# Patient Record
Sex: Female | Born: 1965 | Race: Black or African American | Hispanic: No | Marital: Single | Smoking: Never smoker
Health system: Southern US, Community
[De-identification: ages and names within clinical notes are randomized; demographics above are authoritative.]

## PROBLEM LIST (undated history)

## (undated) HISTORY — PX: APPENDECTOMY: SHX54

---

## 2020-06-05 ENCOUNTER — Emergency Department (HOSPITAL_COMMUNITY)
Admission: EM | Admit: 2020-06-05 | Discharge: 2020-06-06 | Disposition: A | Discharge status: Home / Self Care | Payer: Managed Care, Other (non HMO) | Attending: Emergency Medicine | Admitting: Emergency Medicine

## 2020-06-05 ENCOUNTER — Emergency Department (HOSPITAL_COMMUNITY): Payer: Managed Care, Other (non HMO)

## 2020-06-05 ENCOUNTER — Encounter (HOSPITAL_COMMUNITY): Payer: Self-pay | Admitting: Emergency Medicine

## 2020-06-05 DIAGNOSIS — S99912A Unspecified injury of left ankle, initial encounter: Secondary | ICD-10-CM | POA: Diagnosis present

## 2020-06-05 DIAGNOSIS — S82852A Displaced trimalleolar fracture of left lower leg, initial encounter for closed fracture: Secondary | ICD-10-CM | POA: Insufficient documentation

## 2020-06-05 DIAGNOSIS — W010XXA Fall on same level from slipping, tripping and stumbling without subsequent striking against object, initial encounter: Secondary | ICD-10-CM | POA: Diagnosis not present

## 2020-06-05 DIAGNOSIS — S82892A Other fracture of left lower leg, initial encounter for closed fracture: Secondary | ICD-10-CM

## 2020-06-05 NOTE — ED Triage Notes (Signed)
Patient [place] with EMS from a park , lost her balance and fell , presents with left ankle swelling/deformity .

## 2020-06-06 ENCOUNTER — Emergency Department (HOSPITAL_COMMUNITY): Payer: Managed Care, Other (non HMO)

## 2020-06-06 MED ORDER — OXYCODONE-ACETAMINOPHEN 5-325 MG PO TABS: 1.0000 | ORAL_TABLET | ORAL | 0 refills | Status: DC | PRN

## 2020-06-06 MED ORDER — OXYCODONE-ACETAMINOPHEN 5-325 MG PO TABS
1.0000 | ORAL_TABLET | Freq: Once | ORAL | Status: AC
  Administered 2020-06-06: 1 via ORAL
  Filled 2020-06-06: qty 1

## 2020-06-06 NOTE — TOC Initial Note (Addendum)
Transition of Care [facility]) - Initial/Assessment Note    Patient Details  Name: Autumn Tapia MRN: [id number] Date of Birth: Sep 16, 1965  [facility] of [facility]) CM/SW Contact:    Lockie Pares, RN Phone Number: 06/06/2020, 11:16 AM  Clinical Narrative:                 Patient came to the ED from a park where they lost their balance and fell resulting in a anklle injury. Could not use crutches safely. Walker ordered from [employer]. They will be brining it to the patient shortly.        Patient Goals and CMS Choice        Expected Discharge Plan and Services                                                Prior Living Arrangements/Services                       Activities of Daily Living      Permission Sought/Granted                  Emotional Assessment              Admission diagnosis:  fall; left ankle deformity  There are no problems to display for this patient.  PCP:  System, Provider Not In Pharmacy:  No Pharmacies Listed    Social Determinants of Health (SDOH) Interventions    Readmission Risk Interventions No flowsheet data found.

## 2020-06-06 NOTE — TOC Initial Note (Deleted)
Transition of Care [facility]) - Initial/Assessment Note    Patient Details  Name: Autumn Tapia MRN: [id number] Date of Birth: 07-31-1966  Transition of Care [facility]) CM/SW Contact:    Lockie Pares, RN Phone Number: 06/06/2020, 12:00 PM  Clinical Narrative:                  Ordered walker for patient through [employer]. They will bring it to patient       Patient Goals and CMS Choice        Expected Discharge Plan and Services                           DME Arranged: Dan Humphreys DME Agency: [employer] Date DME Agency Contacted: 06/06/20 Time DME Agency Contacted: [phone] Representative spoke with at DME Agency: Lucretia            Prior Living Arrangements/Services                       Activities of Daily Living      Permission Sought/Granted                  Emotional Assessment              Admission diagnosis:  fall; left ankle deformity  There are no problems to display for this patient.  PCP:  System, Provider Not In Pharmacy:   [id number] - [place], [place] - [address] AT [facility] [address]  [place] [address] Phone: [phone] Fax: [phone]     Social Determinants of Health (SDOH) Interventions    Readmission Risk Interventions No flowsheet data found.

## 2020-06-06 NOTE — Discharge Instructions (Signed)
Your x-rays today confirmed a trimalleolar fracture as we discussed.  The orthopedics team wants to see you this week for further management.  Please keep it elevated " toes above your nose" as we discussed.  You may use the pain medicine help with your symptoms.  Please rest and stay hydrated.  If any symptoms change or worsen, please return to the nearest emergency department.

## 2020-06-06 NOTE — Progress Notes (Signed)
Orthopedic Tech Progress Note Patient Details:  Autumn Tapia 1966/02/28 [id number] Patient unable to use crutches. RN ordered walker. Ortho Devices Type of Ortho Device: Stirrup splint, Post (short leg) splint Ortho Device/Splint Location: LLE Ortho Device/Splint Interventions: Application, Ordered   Post Interventions Patient Tolerated: Well Instructions Provided: Care of device   Alcee Sipos A Dekker Verga 06/06/2020, 9:54 AM

## 2020-06-06 NOTE — ED Notes (Signed)
Reviewed discharge instructions with patient. Follow-up care and medications reviewed. Patient verbalized understanding. Patient A&Ox4, VSS upon discharge. 

## 2020-06-06 NOTE — ED Provider Notes (Signed)
[facility] EMERGENCY DEPARTMENT Provider Note   CSN: [id number] Arrival date & time: 06/05/20  1934     History No chief complaint on file.   Autumn Tapia is a 54 y.o. female.  The history is provided by the patient and medical records. No language interpreter was used.  Ankle Pain Location:  Ankle Time since incident:  14 hours Injury: yes   Mechanism of injury: fall   Fall:    Fall occurred:  Standing   Impact surface:  Grass   Entrapped after fall: no   Ankle location:  L ankle Pain details:    Quality:  Aching and sharp   Radiates to:  Does not radiate   Severity:  Severe   Onset quality:  Sudden   Timing:  Constant   Progression:  Unchanged Chronicity:  New Prior injury to area:  No Relieved by:  Immobilization Worsened by:  Bearing weight Ineffective treatments:  None tried Associated symptoms: no back pain, no fatigue, no fever, no itching, no muscle weakness, no neck pain, no numbness and no stiffness        History reviewed. No pertinent past medical history.  There are no problems to display for this patient.   History reviewed. No pertinent surgical history.   OB History   No obstetric history on file.     No family history on file.  Social History   Tobacco Use  . Smoking status: Never Smoker  . Smokeless tobacco: Never Used  Substance Use Topics  . Alcohol use: Never  . Drug use: Never    Home Medications Prior to Admission medications   Not on File    Allergies    Patient has no known allergies.  Review of Systems   Review of Systems  Constitutional: Negative for chills, diaphoresis, fatigue and fever.  HENT: Negative for congestion.   Respiratory: Negative for cough, chest tightness and shortness of breath.   Cardiovascular: Negative for chest pain.  Gastrointestinal: Negative for abdominal pain, constipation, diarrhea and nausea.  Genitourinary: Negative for flank pain.  Musculoskeletal: Negative for  back pain, neck pain, neck stiffness and stiffness.  Skin: Negative for itching.  Neurological: Negative for syncope, weakness, light-headedness and numbness.  Psychiatric/Behavioral: Negative for agitation and confusion.  All other systems reviewed and are negative.   Physical Exam Updated Vital Signs BP (!) 118/105   Pulse 88   Temp 99.2 F (37.3 C) (Oral)   Resp 16   Ht 5\' 9"  (1.753 m)   Wt 122 kg   SpO2 100%   BMI 39.72 kg/m   Physical Exam Vitals and nursing note reviewed.  Constitutional:      General: She is not in acute distress.    Appearance: She is well-developed. She is not ill-appearing, toxic-appearing or diaphoretic.  HENT:     Head: Normocephalic and atraumatic.     Right Ear: External ear normal.     Left Ear: External ear normal.     Nose: Nose normal.  Eyes:     Conjunctiva/sclera: Conjunctivae normal.     Pupils: Pupils are equal, round, and reactive to light.  Cardiovascular:     Rate and Rhythm: Normal rate.     Pulses: Normal pulses.     Heart sounds: No murmur heard.   Pulmonary:     Effort: Pulmonary effort is normal. No respiratory distress.     Breath sounds: No stridor. No wheezing or rales.  Chest:     Chest  wall: No tenderness.  Abdominal:     General: There is no distension.     Tenderness: There is no abdominal tenderness. There is no rebound.  Musculoskeletal:        General: Swelling, tenderness and signs of injury present.     Cervical back: Normal range of motion and neck supple.     Left ankle: Swelling present. Tenderness present. Normal pulse.       Legs:     Comments: Tenderness and swelling in the left ankle.  No lacerations or open injury seen.  Good pulses, strength and sensation.  No other tenderness present.  Skin:    General: Skin is warm.     Coloration: Skin is not pale.     Findings: No erythema or rash.  Neurological:     General: No focal deficit present.     Mental Status: She is alert and oriented to  person, place, and time.     Sensory: No sensory deficit.     Motor: No weakness or abnormal muscle tone.     Deep Tendon Reflexes: Reflexes are normal and symmetric.  Psychiatric:        Mood and Affect: Mood normal.     ED Results / Procedures / Treatments   Labs (all labs ordered are listed, but only abnormal results are displayed) Labs Reviewed - No data to display  EKG None  Radiology DG Ankle Complete Left  Result Date: 06/05/2020 CLINICAL DATA:  54 year old female with left ankle trauma and pain. EXAM: LEFT ANKLE COMPLETE - 3+ VIEW COMPARISON:  None. FINDINGS: There is a mildly displaced oblique fracture of the distal fibula with approximately 5 mm lateral displacement of the distal fracture fragment. Mildly displaced fracture of the medial malleolus and nondisplaced fracture of the posterior malleolus. No dislocation. There is soft tissue swelling of the ankle. No radiopaque foreign object or soft tissue gas. IMPRESSION: Trimalleolar fracture. No dislocation. Electronically Signed   By: Elgie Collard M.D.   On: 06/05/2020 22:21   CT Ankle Left Wo Contrast  Result Date: 06/06/2020 CLINICAL DATA:  Left ankle fracture, operative planning. EXAM: CT OF THE LEFT ANKLE WITHOUT CONTRAST TECHNIQUE: Multidetector CT imaging of the left ankle was performed according to the standard protocol. Multiplanar CT image reconstructions were also generated. COMPARISON:  Radiographs dated 06/05/2020 FINDINGS: Bones/Joint/Cartilage Stage IV Weber B fracture of the ankle with mildly comminuted oblique fibular component, minimally comminuted transverse medial malleolar component, and mildly comminuted oblique posterior malleolar component otherwise with the typical configuration. 3-4 mm of medial shift of the long axis of the tibial shaft with respect to the talus. This fracture is unstable. No other fractures are identified. Plantar calcaneal spur. Ligaments Suboptimally assessed by CT. Muscles and  Tendons No flexor tendon entrapment. Minimal distal Achilles tendinopathy is suspected. Thickening of the medial band of the plantar fascia, plantar fasciitis not excluded. Soft tissues Expected subcutaneous edema along the ankle circumferentially. IMPRESSION: 1. Stage IV Weber B fracture of the ankle with mildly comminuted oblique fibular component, minimally comminuted transverse medial malleolar component, and mildly comminuted oblique posterior malleolar component. 3-4 mm of medial shift of the long axis of the tibial shaft with respect to the talus. This fracture is unstable. 2. Minimal distal Achilles tendinopathy is suspected. 3. Thickening of the medial band of the plantar fascia, plantar fasciitis not excluded. 4. Plantar calcaneal spur. Electronically Signed   By: Gaylyn Rong M.D.   On: 06/06/2020 09:07    Procedures Procedures (  including critical care time)  Medications Ordered in ED Medications  oxyCODONE-acetaminophen (PERCOCET/ROXICET) 5-325 MG per tablet 1 tablet (1 tablet Oral Given 06/06/20 [id number])    ED Course  I have reviewed the triage vital signs and the nursing notes.  Pertinent labs & imaging results that were available during my care of the patient were reviewed by me and considered in my medical decision making (see chart for details).    MDM Rules/Calculators/A&P                          Autumn AlbinoSean Tapia is a 54 y.o. female with no significant past medical history who presents for left ankle injury.  Patient reports that around 6 PM yesterday, she slipped on some acorns down embankment and twisted her left ankle.  She had immediate onset of severe pain.  She has been unable to bear weight on it.  She denies any history of orthopedic injuries in the past or previous fractures.  He presented the emergency department has been waiting for around 12 hours in the waiting room prior to coming to the exam room.  She denies any other injuries or pains.  She specifically denies any  neck pain, chest pain, back pain, abdominal pain, hip pain, knee pains.  She denies any numbness, tingling, or weakness.  She does report her ankle is now swollen compared to what it was before but no lacerations or open injuries.  On exam, patient has tenderness in the left ankle.  There is palpable DP and PT pulse.  She can wiggle her toes and move her ankle with pain.  She has normal sensation in the toes.  No significant tenderness in the knee or upper leg.  No hip tenderness.  Lungs clear chest nontender.  Abdomen nontender.  Patient resting comfortably.  Patient had x-rays of the left ankle performed in triage showing a trimalleolar fracture with very mild displacement but no dislocation.  We will touch base with orthopedics to discuss timeline for follow-up but I do anticipate patient needing splinting and discharged with crutches to follow-up with orthopedics to discuss likely surgery.  Patient is agreeable to this plan.  Anticipate splint placement and discharge.  Orthopedics called back and recommends a posterior short leg splint with a U stirrup and significant elevation at home.  They also recommended getting a CT scan for operative planning.  This was ordered.  They recommended following with Dr. Victorino DikeHewitt with orthopedic surgery this week for operative management.  After splint is placed, patient will be given crutches and discharged home with written for pain medication and instructions for early follow-up.  Patient will follow up with orthopedics for further management.  Patient agrees with plan of care and was discharged after splint placement  Final Clinical Impression(s) / ED Diagnoses Final diagnoses:  Closed fracture of left ankle, initial encounter    Rx / DC Orders ED Discharge Orders         Ordered    oxyCODONE-acetaminophen (PERCOCET/ROXICET) 5-325 MG tablet  Every 4 hours PRN        06/06/20 1158         Clinical Impression: 1. Closed fracture of left ankle,  initial encounter     Disposition: Discharge  Condition: Good  I have discussed the results, Dx and Tx plan with the pt(& family if present). He/she/they expressed understanding and agree(s) with the plan. Discharge instructions discussed at great length. Strict return precautions discussed and pt &/  or family have verbalized understanding of the instructions. No further questions at time of discharge.    Discharge Medication List as of 06/06/2020 12:00 PM    START taking these medications   Details  oxyCODONE-acetaminophen (PERCOCET/ROXICET) 5-325 MG tablet Take 1 tablet by mouth every 4 (four) hours as needed., Starting Sat 06/06/2020, Normal        Follow Up: Toni Arthurs, MD [address] [place] [address] [place] [address] [phone]     [facility] EMERGENCY DEPARTMENT [address] [id number] [address] [place] [address] [phone]    [facility] [address] [place] [address] [phone] Schedule an appointment as soon as possible for a visit       Adalida Garver, Canary Brim, MD 06/06/20 1546

## 2020-06-06 NOTE — ED Notes (Signed)
Pt unable to use crutches safely.

## 2020-06-23 ENCOUNTER — Ambulatory Visit (HOSPITAL_COMMUNITY): Payer: Managed Care, Other (non HMO) | Admitting: Anesthesiology

## 2020-06-23 ENCOUNTER — Other Ambulatory Visit: Payer: Self-pay

## 2020-06-23 ENCOUNTER — Ambulatory Visit (HOSPITAL_COMMUNITY): Payer: Managed Care, Other (non HMO)

## 2020-06-23 ENCOUNTER — Encounter (HOSPITAL_COMMUNITY): Payer: Self-pay | Admitting: Orthopedic Surgery

## 2020-06-23 ENCOUNTER — Encounter (HOSPITAL_COMMUNITY)
Admission: RE | Disposition: A | Discharge status: Home / Self Care | Payer: Self-pay | Source: Home / Self Care | Attending: Orthopedic Surgery

## 2020-06-23 ENCOUNTER — Ambulatory Visit (HOSPITAL_COMMUNITY)
Admission: RE | Admit: 2020-06-23 | Discharge: 2020-06-23 | Disposition: A | Discharge status: Home / Self Care | Payer: Managed Care, Other (non HMO) | Attending: Orthopedic Surgery | Admitting: Orthopedic Surgery

## 2020-06-23 DIAGNOSIS — Z20822 Contact with and (suspected) exposure to covid-19: Secondary | ICD-10-CM | POA: Diagnosis not present

## 2020-06-23 DIAGNOSIS — Z09 Encounter for follow-up examination after completed treatment for conditions other than malignant neoplasm: Secondary | ICD-10-CM

## 2020-06-23 DIAGNOSIS — W19XXXA Unspecified fall, initial encounter: Secondary | ICD-10-CM | POA: Insufficient documentation

## 2020-06-23 DIAGNOSIS — S82852A Displaced trimalleolar fracture of left lower leg, initial encounter for closed fracture: Secondary | ICD-10-CM | POA: Diagnosis not present

## 2020-06-23 HISTORY — PX: ORIF ANKLE FRACTURE: SHX5408

## 2020-06-23 LAB — POCT PREGNANCY, URINE: Preg Test, Ur: NEGATIVE

## 2020-06-23 LAB — SARS CORONAVIRUS 2 BY RT PCR (HOSPITAL ORDER, PERFORMED IN ~~LOC~~ HOSPITAL LAB): SARS Coronavirus 2: NEGATIVE

## 2020-06-23 SURGERY — OPEN REDUCTION INTERNAL FIXATION (ORIF) ANKLE FRACTURE
Anesthesia: Regional | Site: Ankle | Laterality: Left

## 2020-06-23 MED ORDER — FENTANYL CITRATE (PF) 100 MCG/2ML IJ SOLN
INTRAMUSCULAR | Status: DC | PRN
Start: 2020-06-23 — End: 2020-06-23
  Administered 2020-06-23: 50 ug via INTRAVENOUS
  Administered 2020-06-23 (×2): 100 ug via INTRAVENOUS

## 2020-06-23 MED ORDER — AMISULPRIDE (ANTIEMETIC) 5 MG/2ML IV SOLN: 10.0000 mg | Freq: Once | INTRAVENOUS | Status: DC | PRN

## 2020-06-23 MED ORDER — PHENYLEPHRINE 40 MCG/ML (10ML) SYRINGE FOR IV PUSH (FOR BLOOD PRESSURE SUPPORT)
PREFILLED_SYRINGE | INTRAVENOUS | Status: DC | PRN
  Administered 2020-06-23: 80 ug via INTRAVENOUS

## 2020-06-23 MED ORDER — PROMETHAZINE HCL 25 MG/ML IJ SOLN: 6.2500 mg | INTRAMUSCULAR | Status: DC | PRN

## 2020-06-23 MED ORDER — CEFAZOLIN SODIUM-DEXTROSE 2-4 GM/100ML-% IV SOLN
2.0000 g | INTRAVENOUS | Status: AC
  Administered 2020-06-23: 2 g via INTRAVENOUS

## 2020-06-23 MED ORDER — MIDAZOLAM HCL 2 MG/2ML IJ SOLN: 1.0000 mg | Freq: Once | INTRAMUSCULAR | Status: DC

## 2020-06-23 MED ORDER — DEXMEDETOMIDINE (PRECEDEX) IN NS 20 MCG/5ML (4 MCG/ML) IV SYRINGE
PREFILLED_SYRINGE | INTRAVENOUS | Status: DC | PRN
  Administered 2020-06-23 (×3): 4 ug via INTRAVENOUS
  Administered 2020-06-23: 8 ug via INTRAVENOUS

## 2020-06-23 MED ORDER — CEFAZOLIN SODIUM-DEXTROSE 2-4 GM/100ML-% IV SOLN
INTRAVENOUS | Status: AC
  Filled 2020-06-23: qty 100

## 2020-06-23 MED ORDER — PROPOFOL 10 MG/ML IV BOLUS
INTRAVENOUS | Status: DC | PRN
  Administered 2020-06-23: 200 mg via INTRAVENOUS

## 2020-06-23 MED ORDER — FENTANYL CITRATE (PF) 250 MCG/5ML IJ SOLN
INTRAMUSCULAR | Status: AC
  Filled 2020-06-23: qty 5

## 2020-06-23 MED ORDER — CHLORHEXIDINE GLUCONATE 0.12 % MT SOLN: 15.0000 mL | Freq: Once | OROMUCOSAL | Status: AC

## 2020-06-23 MED ORDER — LACTATED RINGERS IV SOLN: INTRAVENOUS | Status: DC

## 2020-06-23 MED ORDER — DEXAMETHASONE SODIUM PHOSPHATE 10 MG/ML IJ SOLN
INTRAMUSCULAR | Status: AC
  Filled 2020-06-23: qty 1

## 2020-06-23 MED ORDER — MIDAZOLAM HCL 2 MG/2ML IJ SOLN
INTRAMUSCULAR | Status: AC
  Administered 2020-06-23: 2 mg via INTRAVENOUS
  Filled 2020-06-23: qty 2

## 2020-06-23 MED ORDER — MIDAZOLAM HCL 2 MG/2ML IJ SOLN: 2.0000 mg | Freq: Once | INTRAMUSCULAR | Status: AC

## 2020-06-23 MED ORDER — OXYCODONE HCL 5 MG PO TABS: 5.0000 mg | ORAL_TABLET | ORAL | 0 refills | Status: AC | PRN

## 2020-06-23 MED ORDER — ROPIVACAINE HCL 5 MG/ML IJ SOLN
INTRAMUSCULAR | Status: DC | PRN
  Administered 2020-06-23: 50 mL via EPIDURAL

## 2020-06-23 MED ORDER — DEXAMETHASONE SODIUM PHOSPHATE 10 MG/ML IJ SOLN
INTRAMUSCULAR | Status: DC | PRN
  Administered 2020-06-23: 8 mg via INTRAVENOUS

## 2020-06-23 MED ORDER — LORAZEPAM 2 MG/ML IJ SOLN
2.0000 mg | Freq: Once | INTRAMUSCULAR | Status: AC | PRN
  Administered 2020-06-23: 2 mg via INTRAVENOUS

## 2020-06-23 MED ORDER — BUPIVACAINE HCL (PF) 0.5 % IJ SOLN
INTRAMUSCULAR | Status: AC
  Filled 2020-06-23: qty 30

## 2020-06-23 MED ORDER — LORAZEPAM 2 MG/ML IJ SOLN
INTRAMUSCULAR | Status: AC
  Filled 2020-06-23: qty 1

## 2020-06-23 MED ORDER — OXYCODONE HCL 5 MG/5ML PO SOLN: 5.0000 mg | Freq: Once | ORAL | Status: DC | PRN

## 2020-06-23 MED ORDER — FENTANYL CITRATE (PF) 100 MCG/2ML IJ SOLN: 50.0000 ug | Freq: Once | INTRAMUSCULAR | Status: AC

## 2020-06-23 MED ORDER — OXYCODONE HCL 5 MG PO TABS: 5.0000 mg | ORAL_TABLET | Freq: Once | ORAL | Status: DC | PRN

## 2020-06-23 MED ORDER — CHLORHEXIDINE GLUCONATE 0.12 % MT SOLN
OROMUCOSAL | Status: AC
  Administered 2020-06-23: 15 mL via OROMUCOSAL
  Filled 2020-06-23: qty 15

## 2020-06-23 MED ORDER — ONDANSETRON 4 MG PO TBDP: 4.0000 mg | ORAL_TABLET | Freq: Three times a day (TID) | ORAL | 0 refills | Status: AC | PRN

## 2020-06-23 MED ORDER — FENTANYL CITRATE (PF) 100 MCG/2ML IJ SOLN
INTRAMUSCULAR | Status: AC
  Administered 2020-06-23: 50 ug via INTRAVENOUS
  Filled 2020-06-23: qty 2

## 2020-06-23 MED ORDER — ONDANSETRON HCL 4 MG/2ML IJ SOLN
INTRAMUSCULAR | Status: DC | PRN
  Administered 2020-06-23: 4 mg via INTRAVENOUS

## 2020-06-23 MED ORDER — FENTANYL CITRATE (PF) 100 MCG/2ML IJ SOLN
INTRAMUSCULAR | Status: AC
  Filled 2020-06-23: qty 2

## 2020-06-23 MED ORDER — PROPOFOL 10 MG/ML IV BOLUS
INTRAVENOUS | Status: AC
  Filled 2020-06-23: qty 20

## 2020-06-23 MED ORDER — ORAL CARE MOUTH RINSE: 15.0000 mL | Freq: Once | OROMUCOSAL | Status: AC

## 2020-06-23 MED ORDER — FENTANYL CITRATE (PF) 100 MCG/2ML IJ SOLN
25.0000 ug | INTRAMUSCULAR | Status: DC | PRN
  Administered 2020-06-23 (×2): 50 ug via INTRAVENOUS

## 2020-06-23 MED ORDER — 0.9 % SODIUM CHLORIDE (POUR BTL) OPTIME
TOPICAL | Status: DC | PRN
  Administered 2020-06-23: 1000 mL

## 2020-06-23 MED ORDER — LIDOCAINE 2% (20 MG/ML) 5 ML SYRINGE
INTRAMUSCULAR | Status: DC | PRN
  Administered 2020-06-23: 80 mg via INTRAVENOUS

## 2020-06-23 SURGICAL SUPPLY — 67 items
ALCOHOL 70% 16 OZ (MISCELLANEOUS) ×3 IMPLANT
BANDAGE ESMARK 6X9 LF (GAUZE/BANDAGES/DRESSINGS) ×1 IMPLANT
BIT DRILL 2 CANN GRADUATED (BIT) ×3 IMPLANT
BIT DRILL 2.5 CANN LNG (BIT) ×3 IMPLANT
BIT DRILL 3.5 CANN STRL (BIT) ×3 IMPLANT
BNDG COHESIVE 4X5 TAN STRL (GAUZE/BANDAGES/DRESSINGS) ×3 IMPLANT
BNDG ELASTIC 4X5.8 VLCR STR LF (GAUZE/BANDAGES/DRESSINGS) ×3 IMPLANT
BNDG ELASTIC 6X5.8 VLCR STR LF (GAUZE/BANDAGES/DRESSINGS) ×6 IMPLANT
BNDG ESMARK 6X9 LF (GAUZE/BANDAGES/DRESSINGS) ×3
CANISTER SUCT 3000ML PPV (MISCELLANEOUS) ×3 IMPLANT
COVER SURGICAL LIGHT HANDLE (MISCELLANEOUS) ×3 IMPLANT
COVER WAND RF STERILE (DRAPES) IMPLANT
CUFF TOURN SGL QUICK 34 (TOURNIQUET CUFF)
CUFF TRNQT CYL 34X4.125X (TOURNIQUET CUFF) IMPLANT
DRAPE C-ARM 42X72 X-RAY (DRAPES) ×3 IMPLANT
DRAPE C-ARMOR (DRAPES) ×3 IMPLANT
DRAPE U-SHAPE 47X51 STRL (DRAPES) ×3 IMPLANT
DRSG ADAPTIC 3X8 NADH LF (GAUZE/BANDAGES/DRESSINGS) ×3 IMPLANT
DRSG PAD ABDOMINAL 8X10 ST (GAUZE/BANDAGES/DRESSINGS) ×6 IMPLANT
DURAPREP 26ML APPLICATOR (WOUND CARE) ×3 IMPLANT
ELECT REM PT RETURN 9FT ADLT (ELECTROSURGICAL) ×3
ELECTRODE REM PT RTRN 9FT ADLT (ELECTROSURGICAL) ×1 IMPLANT
GAUZE SPONGE 4X4 12PLY STRL (GAUZE/BANDAGES/DRESSINGS) ×6 IMPLANT
GLOVE BIO SURGEON STRL SZ7.5 (GLOVE) ×6 IMPLANT
GLOVE BIOGEL PI IND STRL 8 (GLOVE) ×2 IMPLANT
GLOVE BIOGEL PI INDICATOR 8 (GLOVE) ×4
GOWN STRL REUS W/ TWL LRG LVL3 (GOWN DISPOSABLE) ×2 IMPLANT
GOWN STRL REUS W/ TWL XL LVL3 (GOWN DISPOSABLE) ×2 IMPLANT
GOWN STRL REUS W/TWL LRG LVL3 (GOWN DISPOSABLE) ×4
GOWN STRL REUS W/TWL XL LVL3 (GOWN DISPOSABLE) ×4
GUIDEWIRE 1.35MM (WIRE) ×6 IMPLANT
KIT BASIN OR (CUSTOM PROCEDURE TRAY) ×3 IMPLANT
KIT TURNOVER KIT B (KITS) ×3 IMPLANT
NEEDLE HYPO 25GX1X1/2 BEV (NEEDLE) IMPLANT
NS IRRIG 1000ML POUR BTL (IV SOLUTION) ×3 IMPLANT
PACK ORTHO EXTREMITY (CUSTOM PROCEDURE TRAY) ×3 IMPLANT
PAD ARMBOARD 7.5X6 YLW CONV (MISCELLANEOUS) ×6 IMPLANT
PAD CAST 4YDX4 CTTN HI CHSV (CAST SUPPLIES) ×2 IMPLANT
PADDING CAST COTTON 4X4 STRL (CAST SUPPLIES) ×4
PADDING CAST COTTON 6X4 STRL (CAST SUPPLIES) ×3 IMPLANT
PADDING CAST SYNTHETIC 4 (CAST SUPPLIES) ×2
PADDING CAST SYNTHETIC 4X4 STR (CAST SUPPLIES) ×1 IMPLANT
PLATE LOCK DIST FIB 5H TTNIUM (Plate) ×3 IMPLANT
SCREW CANN ST QFIX 4X32 (Screw) ×6 IMPLANT
SCREW CORT FT 3.5X22 (Screw) ×3 IMPLANT
SCREW CORT FT 3.5X28 (Screw) ×3 IMPLANT
SCREW LOCK 18X3XVALOPRFL (Screw) ×2 IMPLANT
SCREW LOCKING 3.0X18 (Screw) ×4 IMPLANT
SCREW LOCKING VARIABLE 3.0X16 (Screw) ×6 IMPLANT
SCREW LP TI 3.5X14MM (Screw) ×9 IMPLANT
SCREW QCKFIX CANN 4.0X40MM (Screw) ×3 IMPLANT
SPLINT FIBERGLASS 4X30 (CAST SUPPLIES) ×6 IMPLANT
SPONGE LAP 18X18 RF (DISPOSABLE) IMPLANT
SUCTION FRAZIER HANDLE 10FR (MISCELLANEOUS) ×2
SUCTION TUBE FRAZIER 10FR DISP (MISCELLANEOUS) ×1 IMPLANT
SUT ETHILON 3 0 PS 1 (SUTURE) ×9 IMPLANT
SUT SILK 2 0 (SUTURE) ×2
SUT SILK 2-0 18XBRD TIE 12 (SUTURE) ×1 IMPLANT
SUT VIC AB 0 CT1 27 (SUTURE)
SUT VIC AB 0 CT1 27XBRD ANBCTR (SUTURE) IMPLANT
SUT VIC AB 2-0 CT1 27 (SUTURE) ×4
SUT VIC AB 2-0 CT1 TAPERPNT 27 (SUTURE) ×2 IMPLANT
SYR CONTROL 10ML LL (SYRINGE) IMPLANT
TOWEL GREEN STERILE (TOWEL DISPOSABLE) ×6 IMPLANT
TUBE CONNECTING 12'X1/4 (SUCTIONS) ×1
TUBE CONNECTING 12X1/4 (SUCTIONS) ×2 IMPLANT
YANKAUER SUCT BULB TIP NO VENT (SUCTIONS) ×3 IMPLANT

## 2020-06-23 NOTE — Brief Op Note (Signed)
06/23/2020  3:02 PM  PATIENT:  Autumn Tapia  54 y.o. female  PRE-OPERATIVE DIAGNOSIS:  Left ankle bimalleolar fracture  POST-OPERATIVE DIAGNOSIS:  Same  PROCEDURE:  OPEN REDUCTION INTERNAL FIXATION (ORIF) BIMALLEOLAR ANKLE FRACTURE  SURGEON:  Yolonda Kida, MD   ASSISTANT: Dion Saucier, PA-C  ANESTHESIA:   General  COMPLICATIONS: None

## 2020-06-23 NOTE — Anesthesia Procedure Notes (Signed)
Procedure Name: LMA Insertion Date/Time: 06/23/2020 1:13 PM Performed by: Jodell Cipro, CRNA Pre-anesthesia Checklist: Patient identified, Emergency Drugs available, Suction available and Patient being monitored Patient Re-evaluated:Patient Re-evaluated prior to induction Oxygen Delivery Method: Circle System Utilized Preoxygenation: Pre-oxygenation with 100% oxygen Induction Type: IV induction Ventilation: Mask ventilation without difficulty LMA: LMA inserted LMA Size: 4.0 Number of attempts: 1 Placement Confirmation: positive ETCO2 Tube secured with: Tape Dental Injury: Teeth and Oropharynx as per pre-operative assessment

## 2020-06-23 NOTE — Anesthesia Preprocedure Evaluation (Addendum)
Anesthesia Evaluation  Patient identified by MRN, date of birth, ID band Patient awake    Reviewed: Allergy & Precautions, NPO status , Patient's Chart, lab work & pertinent test results  Airway Mallampati: II  TM Distance: >3 FB Neck ROM: Full    Dental no notable dental hx.    Pulmonary neg pulmonary ROS,    Pulmonary exam normal breath sounds clear to auscultation       Cardiovascular Exercise Tolerance: Good negative cardio ROS Normal cardiovascular exam Rhythm:Regular Rate:Normal     Neuro/Psych negative neurological ROS     GI/Hepatic negative GI ROS, Neg liver ROS,   Endo/Other  Morbid obesity  Renal/GU negative Renal ROS     Musculoskeletal negative musculoskeletal ROS (+)   Abdominal (+) + obese,   Peds  Hematology negative hematology ROS (+)   Anesthesia Other Findings Ankle fracture  Reproductive/Obstetrics negative OB ROS                            Anesthesia Physical Anesthesia Plan  ASA: II  Anesthesia Plan: General and Regional   Post-op Pain Management: GA combined w/ Regional for post-op pain   Induction:   PONV Risk Score and Plan: 3 and Midazolam, Dexamethasone and Ondansetron  Airway Management Planned: Oral ETT  Additional Equipment:   Intra-op Plan:   Post-operative Plan: Extubation in OR  Informed Consent: I have reviewed the patients History and Physical, chart, labs and discussed the procedure including the risks, benefits and alternatives for the proposed anesthesia with the patient or authorized representative who has indicated his/her understanding and acceptance.       Plan Discussed with: CRNA and Anesthesiologist  Anesthesia Plan Comments:        Anesthesia Quick Evaluation

## 2020-06-23 NOTE — Transfer of Care (Signed)
Immediate Anesthesia Transfer of Care Note  Patient: Autumn Tapia  Procedure(s) Performed: OPEN REDUCTION INTERNAL FIXATION (ORIF) BIMALLEOLAR ANKLE FRACTURE (Left Ankle)  Patient Location: PACU  Anesthesia Type:GA combined with regional for post-op pain  Level of Consciousness: awake, alert , oriented and patient cooperative  Airway & Oxygen Therapy: Patient Spontanous Breathing  Post-op Assessment: Report given to RN, Post -op Vital signs reviewed and stable and Patient moving all extremities X 4  Post vital signs: Reviewed and stable  Last Vitals:  Vitals Value Taken Time  BP 146/119 06/23/20 1505  Temp    Pulse 106 06/23/20 1506  Resp 31 06/23/20 1506  SpO2 97 % 06/23/20 1506  Vitals shown include unvalidated device data.  Last Pain:  Vitals:   06/23/20 1255  TempSrc:   PainSc: 0-No pain      Patients Stated Pain Goal: 3 (06/23/20 1255)  Complications: No complications documented.

## 2020-06-23 NOTE — Op Note (Signed)
Date of Surgery: 06/23/2020  INDICATIONS: Ms. Steveson is a 54 y.o.-year-old female who sustained a left ankle fracture; she was indicated for open reduction and internal fixation due to the displaced nature of the articular fracture and came to the operating room today for this procedure. The patient did consent to the procedure after discussion of the risks and benefits.  PREOPERATIVE DIAGNOSIS: left closed trimalleolar ankle fracture  POSTOPERATIVE DIAGNOSIS: Same.  PROCEDURE: Open treatment of left ankle fracture with internal fixation.  Trimalleolar w/o fixation of posterior malleolus CPT 27822.   SURGEON: Gevork Ayyad P. Aundria Rud, M.D.  ASSIST: Dion Saucier, PA-C  Attestation:  PA Sharon Seller was present for the entire procedure.  He was used for positioning the patient and reduction of fracture and instrumentation of the hardware.  He was used for closure and application of short leg splint..  ANESTHESIA:  general, adductor cannal regional  TOURNIQUET TIME: 300 mmHg for 80 minutes  IV FLUIDS AND URINE: See anesthesia.  ESTIMATED BLOOD LOSS: 20 mL.  IMPLANTS: Arthrex distal fibular plate with distal locking screws and proximal cortical nonlocking Medially to 4.0 mm cannulated screws  COMPLICATIONS: None.  DESCRIPTION OF PROCEDURE: The patient was brought to the operating room and placed supine on the operating table.  The patient had been signed prior to the procedure and this was documented. The patient had the anesthesia placed by the anesthesiologist.  A nonsterile tourniquet was placed on the upper thigh.  The prep verification and incision time-outs were performed to confirm that this was the correct patient, site, side and location. The patient had an SCD on the opposite lower extremity. The patient did receive antibiotics prior to the incision and was re-dosed during the procedure as needed at indicated intervals.  The patient had the lower extremity prepped and draped in the standard  surgical fashion.  The extremity was exsanguinated using an esmarch bandage and the tourniquet was inflated to 300 mm Hg.   Incision was made over the distal fibula and the fracture was exposed and reduced anatomically with a clamp. A lag screw was placed. I then applied a distal fibular locking plate and secured it proximally with non-locking screws. Distally secured with locking screws.  Bone quality was poor. I used c-arm to confirm satisfactory reduction and fixation.   I then turned my attention to the medial malleolus. Incision was made over the medial malleolus and the fracture exposed and held provisionally with a clamp. 2 guidepins were placed for the 4.0 mm cannulated screws and then confirmation of reduction was made with fluoroscopy. I then placed 2  78mm screws which had satisfactory fixation.   The syndesmosis was stressed using live fluoroscopy and found to be stable.   The wounds were irrigated, and closed with vicryl with routine closure for the skin. The wounds were not injected with local anesthetic. Sterile gauze was applied followed by a posterior splint with a stirrup. She was awakened and returned to the PACU in stable and satisfactory condition. There were no complications.    POSTOPERATIVE PLAN: Ms. Cowman will remain nonweightbearing on this leg for approximately 6 weeks; Ms. Fosse will return for suture removal in 2 weeks.  He will be immobilized in a short leg splint and then transitioned to a CAM walker at his first follow up appointment.  Ms. Kley will receive DVT prophylaxis based on other medications, activity level, and risk ratio of bleeding to thrombosis.  She will use 81 mg asa x 6 weeks.  Barbara Cower  Johnnette Litter, MD [facility] [place] [phone] 3:03 PM

## 2020-06-23 NOTE — Anesthesia Procedure Notes (Signed)
Anesthesia Regional Block: Popliteal block   Pre-Anesthetic Checklist: ,, timeout performed, Correct Patient, Correct Site, Correct Laterality, Correct Procedure, Correct Position, site marked, Risks and benefits discussed,  Surgical consent,  Pre-op evaluation,  At surgeon's request and post-op pain management  Laterality: Left  Prep: chloraprep       Needles:  Injection technique: Single-shot  Needle Type: Echogenic Stimulator Needle     Needle Length: 10cm      Additional Needles:   Procedures:,,,, ultrasound used (permanent image in chart),,,,  Narrative:  Start time: 06/23/2020 12:40 PM End time: 06/23/2020 12:50 PM Injection made incrementally with aspirations every 5 mL.  Performed by: Personally  Anesthesiologist: Mellody Dance, MD  Additional Notes: A functioning IV was confirmed and monitors were applied.  Sterile prep and drape, hand hygiene and sterile gloves were used.  Negative aspiration and test dose prior to incremental administration of local anesthetic. The patient tolerated the procedure well.Ultrasound  guidance: relevant anatomy identified, needle position confirmed, local anesthetic spread visualized around nerve(s), vascular puncture avoided.  Image printed for medical record.

## 2020-06-23 NOTE — Discharge Instructions (Signed)
-   maintain left leg in splint at all times and No weight bearing to left leg.  - elevate the left leg as able around the clock with 'toes above nose.'  - for mild to moderate pain use tylenol and ibuprofen around the clock every 6 hours respectively.   For breakthrough pain use oxycodone as necessary  - keep your splint dry   - for prevention of blood clots take an 81 mg asa once daily for 6 weeks.  - return to see Dr. Aundria Rud in 2 weeks.

## 2020-06-23 NOTE — H&P (Signed)
ORTHOPAEDIC H and P  REQUESTING PHYSICIAN: Yolonda Kida, MD  PCP:  System, Provider Not In  Chief Complaint: Left ankle fracture  HPI: Autumn Tapia is a 54 y.o. female who complains of left ankle pain following a fall on 06/05/2020.  Here today for ORIF.  No new complaints.  Ready for surgery.  History reviewed. No pertinent past medical history. Past Surgical History:  Procedure Laterality Date  . APPENDECTOMY     Social History   Socioeconomic History  . Marital status: Single    Spouse name: Not on file  . Number of children: Not on file  . Years of education: Not on file  . Highest education level: Not on file  Occupational History  . Not on file  Tobacco Use  . Smoking status: Never Smoker  . Smokeless tobacco: Never Used  Vaping Use  . Vaping Use: Never used  Substance and Sexual Activity  . Alcohol use: Never  . Drug use: Never  . Sexual activity: Not Currently  Other Topics Concern  . Not on file  Social History Narrative  . Not on file   Social Determinants of Health   Financial Resource Strain:   . Difficulty of Paying Living Expenses: Not on file  Food Insecurity:   . Worried About Programme researcher, broadcasting/film/video in the Last Year: Not on file  . Ran Out of Food in the Last Year: Not on file  Transportation Needs:   . Lack of Transportation (Medical): Not on file  . Lack of Transportation (Non-Medical): Not on file  Physical Activity:   . Days of Exercise per Week: Not on file  . Minutes of Exercise per Session: Not on file  Stress:   . Feeling of Stress : Not on file  Social Connections:   . Frequency of Communication with Friends and Family: Not on file  . Frequency of Social Gatherings with Friends and Family: Not on file  . Attends Religious Services: Not on file  . Active Member of Clubs or Organizations: Not on file  . Attends Banker Meetings: Not on file  . Marital Status: Not on file   History reviewed. No pertinent family  history. No Known Allergies Prior to Admission medications   Medication Sig Start Date End Date Taking? Authorizing Provider  oxyCODONE-acetaminophen (PERCOCET/ROXICET) 5-325 MG tablet Take 1 tablet by mouth every 4 (four) hours as needed. Patient not taking: Reported on 06/16/2020 06/06/20   Tegeler, Canary Brim, MD   No results found.  Positive ROS: All other systems have been reviewed and were otherwise negative with the exception of those mentioned in the HPI and as above.  Physical Exam: General: Alert, no acute distress Cardiovascular: No pedal edema Respiratory: No cyanosis, no use of accessory musculature GI: No organomegaly, abdomen is soft and non-tender Skin: No lesions in the area of chief complaint Neurologic: Sensation intact distally Psychiatric: Patient is competent for consent with normal mood and affect Lymphatic: No axillary or cervical lymphadenopathy  MUSCULOSKELETAL:  LLE-  Splint in place.  Distally NVI  Assessment: Left ankle closed trimalleolar ankle fracture  Plan: - The risks, benefits, and alternatives were discussed with the patient. There are risks associated with the surgery including, but not limited to, problems with anesthesia (death), infection, differences in leg length/angulation/rotation, fracture of bones, loosening or failure of implants, malunion, nonunion, hematoma (blood accumulation) which may require surgical drainage, blood clots, pulmonary embolism, nerve injury (foot drop), and blood vessel injury. The  patient understands these risks and elects to proceed.  - plan for tentative dc home post op    Yolonda Kida, MD Cell [phone]    06/23/2020 12:43 PM

## 2020-06-23 NOTE — Anesthesia Procedure Notes (Signed)
Anesthesia Regional Block: Adductor canal block   Pre-Anesthetic Checklist: ,, timeout performed, Correct Patient, Correct Site, Correct Laterality, Correct Procedure, Correct Position, site marked, Risks and benefits discussed,  Surgical consent,  Pre-op evaluation,  At surgeon's request and post-op pain management  Laterality: Left  Prep: chloraprep       Needles:  Injection technique: Single-shot  Needle Type: Echogenic Stimulator Needle     Needle Length: 10cm      Additional Needles:   Procedures:,,,, ultrasound used (permanent image in chart),,,,  Narrative:  Start time: 06/23/2020 12:40 PM End time: 06/23/2020 12:50 PM Injection made incrementally with aspirations every 5 mL.  Performed by: Personally  Anesthesiologist: Mellody Dance, MD  Additional Notes: A functioning IV was confirmed and monitors were applied.  Sterile prep and drape, hand hygiene and sterile gloves were used.  Negative aspiration and test dose prior to incremental administration of local anesthetic. The patient tolerated the procedure well.Ultrasound  guidance: relevant anatomy identified, needle position confirmed, local anesthetic spread visualized around nerve(s), vascular puncture avoided.  Image printed for medical record.

## 2020-06-24 NOTE — Anesthesia Postprocedure Evaluation (Signed)
Anesthesia Post Note  Patient: Autumn Tapia  Procedure(s) Performed: OPEN REDUCTION INTERNAL FIXATION (ORIF) BIMALLEOLAR ANKLE FRACTURE (Left Ankle)     Patient location during evaluation: PACU Anesthesia Type: Regional and General Level of consciousness: sedated Pain management: pain level controlled Vital Signs Assessment: post-procedure vital signs reviewed and stable Respiratory status: spontaneous breathing and respiratory function stable Cardiovascular status: stable Postop Assessment: no apparent nausea or vomiting Anesthetic complications: no   No complications documented.  Last Vitals:  Vitals:   06/23/20 1615 06/23/20 1631  BP: (!) 141/82 133/81  Pulse: 87 82  Resp: 19 (!) 24  Temp:    SpO2: 98% 100%    Last Pain:  Vitals:   06/23/20 1631  TempSrc:   PainSc: Asleep                 Mellody Dance

## 2020-06-25 ENCOUNTER — Encounter (HOSPITAL_COMMUNITY): Payer: Self-pay | Admitting: Orthopedic Surgery

## 2021-08-31 IMAGING — DX DG ANKLE COMPLETE 3+V*L*
3 series · 3 of 3 positions shown · non-contrast
Comparison: None.

CLINICAL DATA: 54-year-old female with left ankle trauma and pain.

EXAM:
LEFT ANKLE COMPLETE - 3+ VIEW

[x ankle ap left]
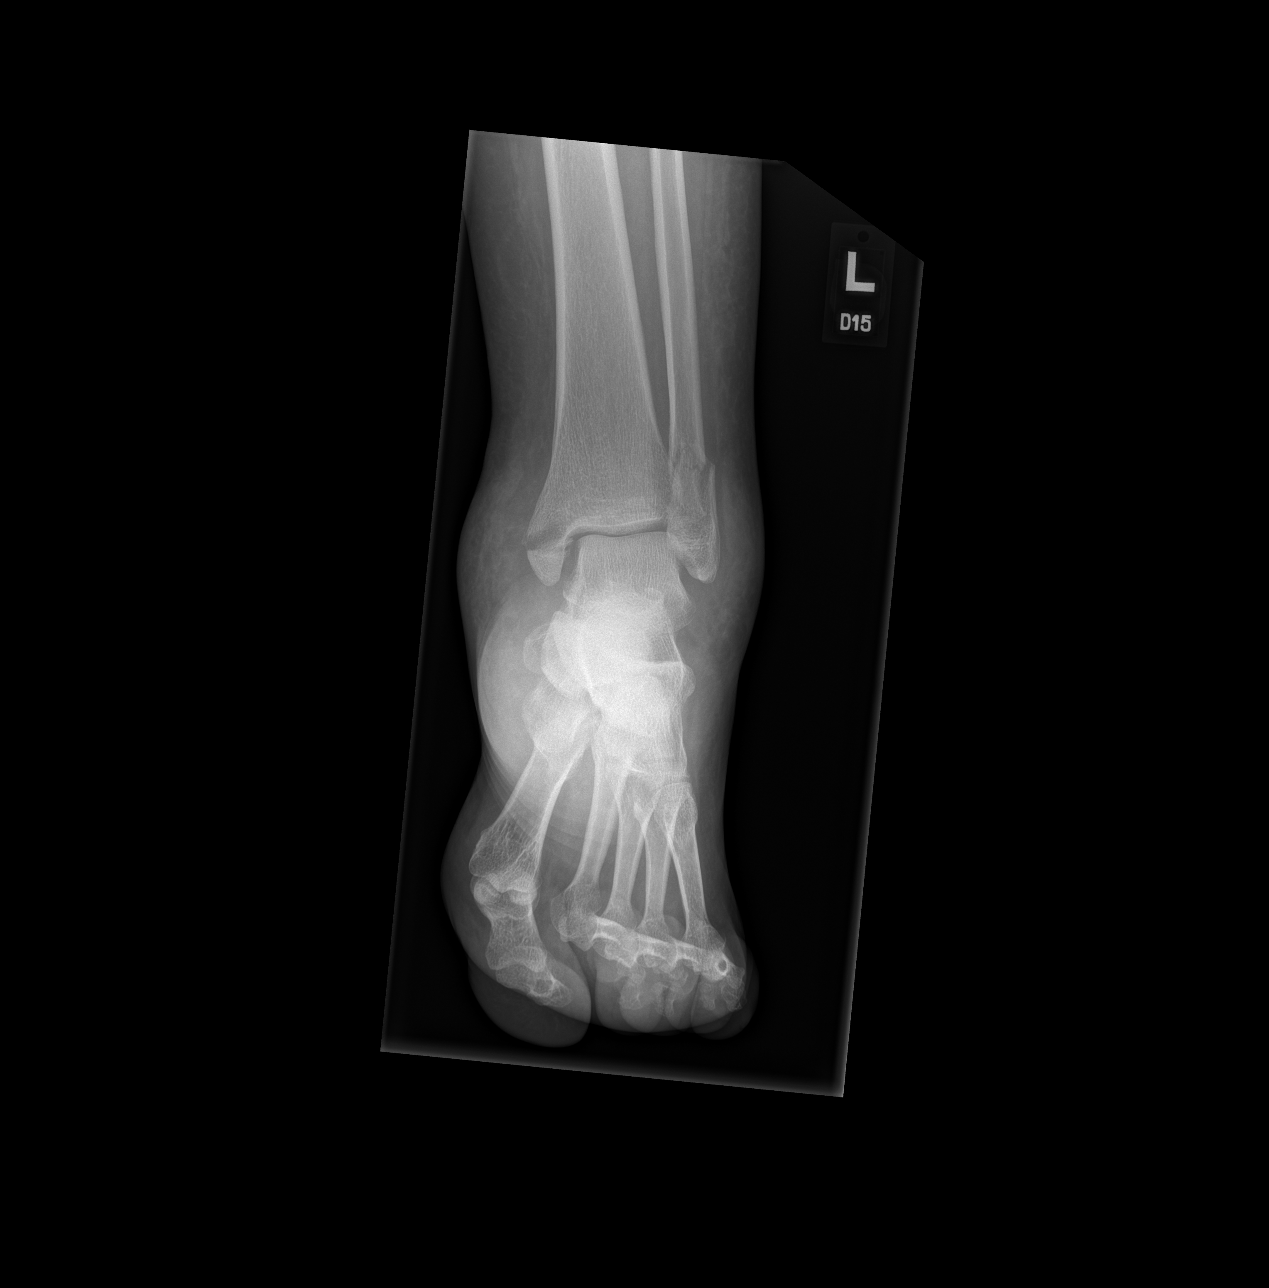

[x ankle obl left]
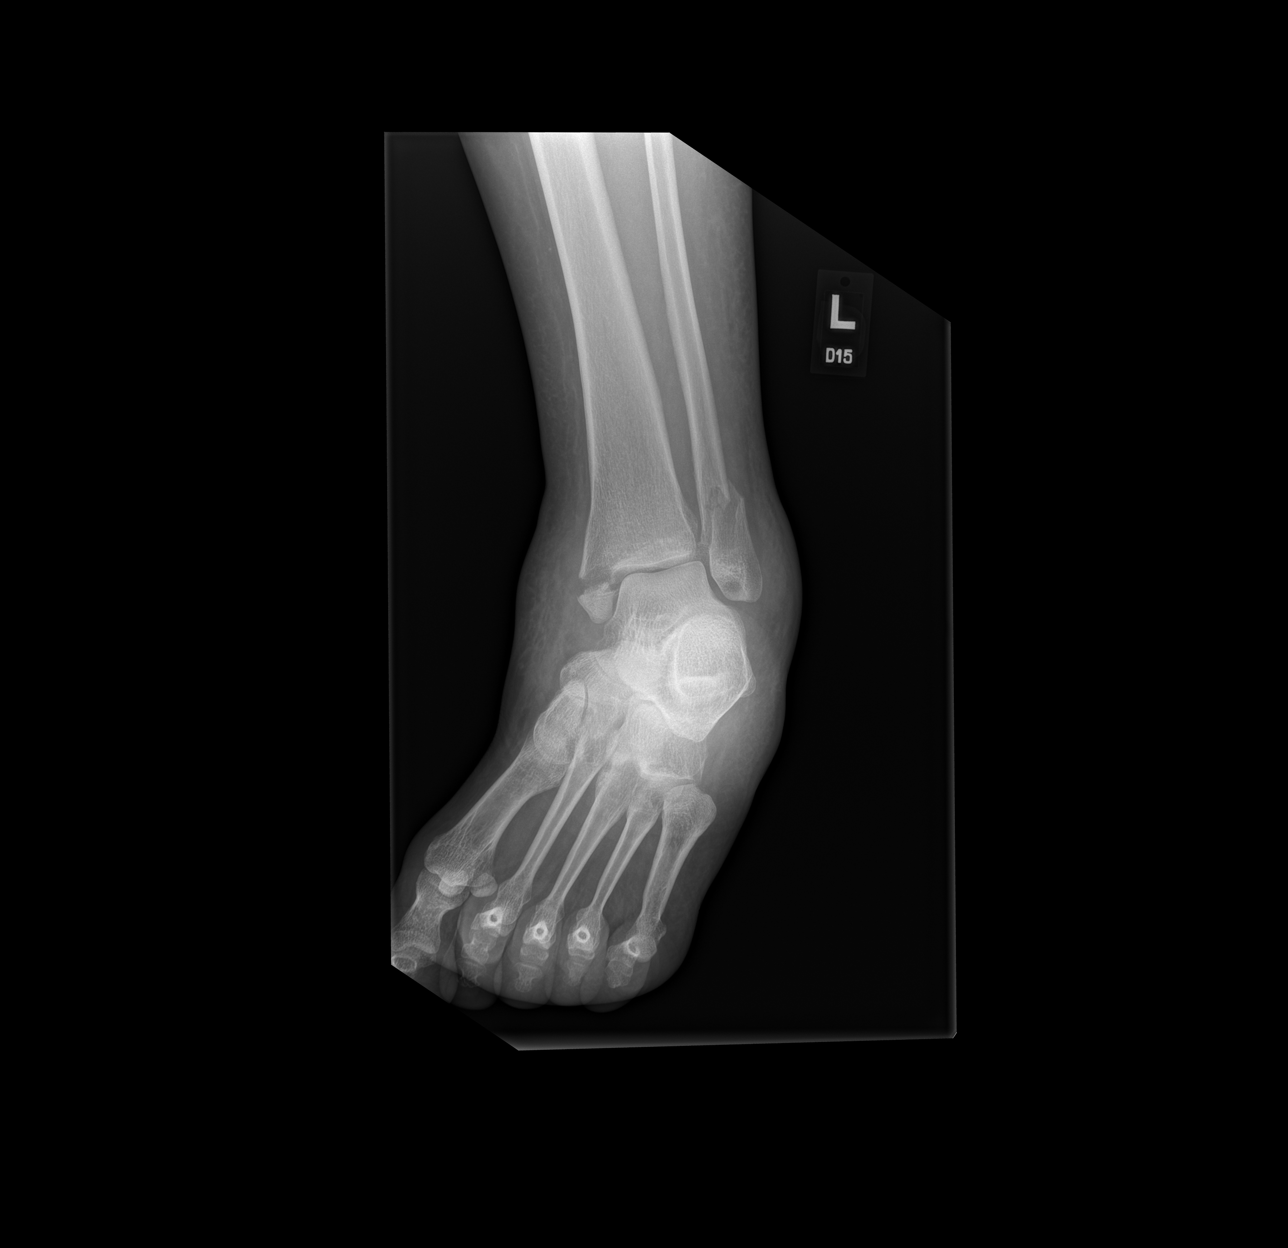

[x ankle lat left]
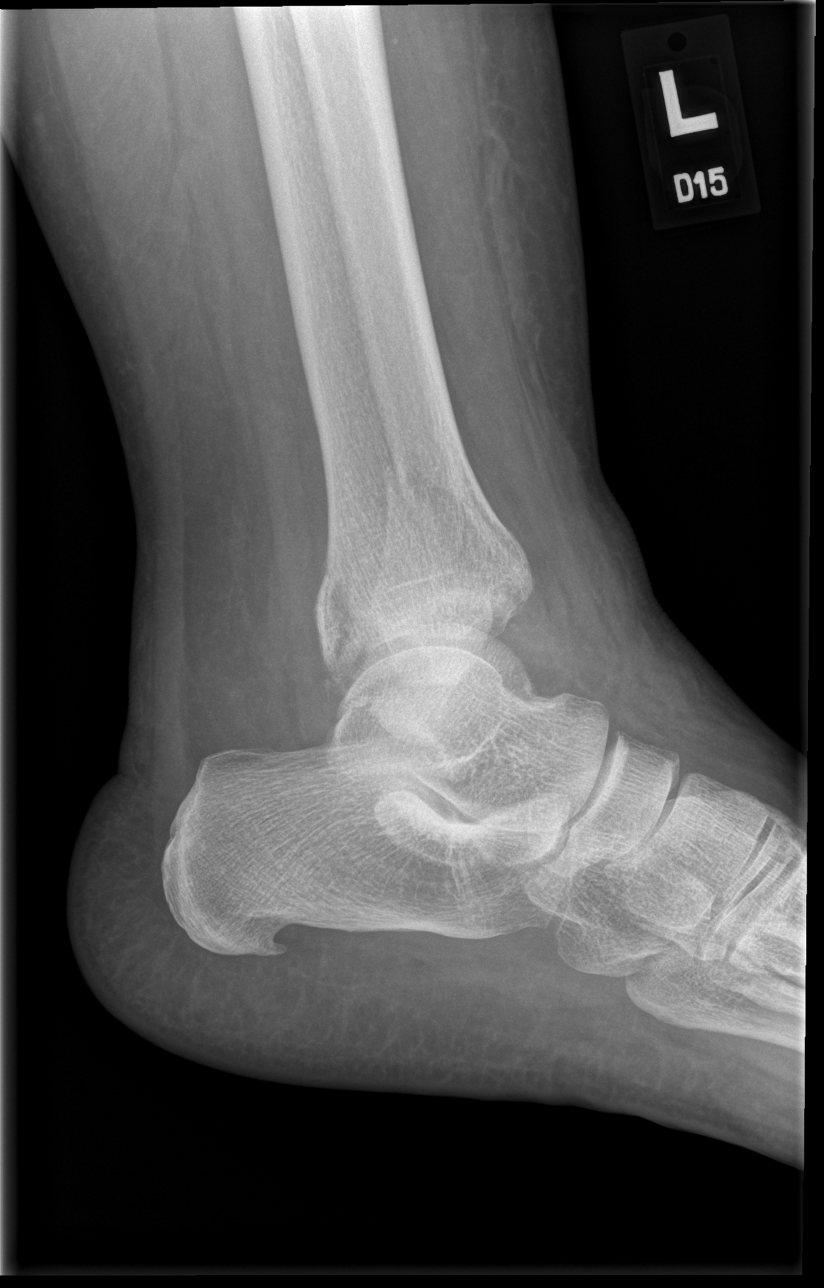

[3 of 3 positions shown; findings below may reference images not displayed]

FINDINGS: There is a mildly displaced oblique fracture of the distal fibula
with approximately 5 mm lateral displacement of the distal fracture
fragment. Mildly displaced fracture of the medial malleolus and
nondisplaced fracture of the posterior malleolus. No dislocation.
There is soft tissue swelling of the ankle. No radiopaque foreign
object or soft tissue gas.
IMPRESSION: Trimalleolar fracture. No dislocation.

## 2021-09-18 IMAGING — RF DG C-ARM 1-60 MIN
1 series · 4 of 4 positions shown · non-contrast
Comparison: CT 06/06/2020, radiographs 06/05/2020.

CLINICAL DATA: ORIF

EXAM:
LEFT ANKLE - 2 VIEW; DG C-ARM 1-60 MIN

[Series 1: run · 4 of 4 slices shown]
[im 1/4]
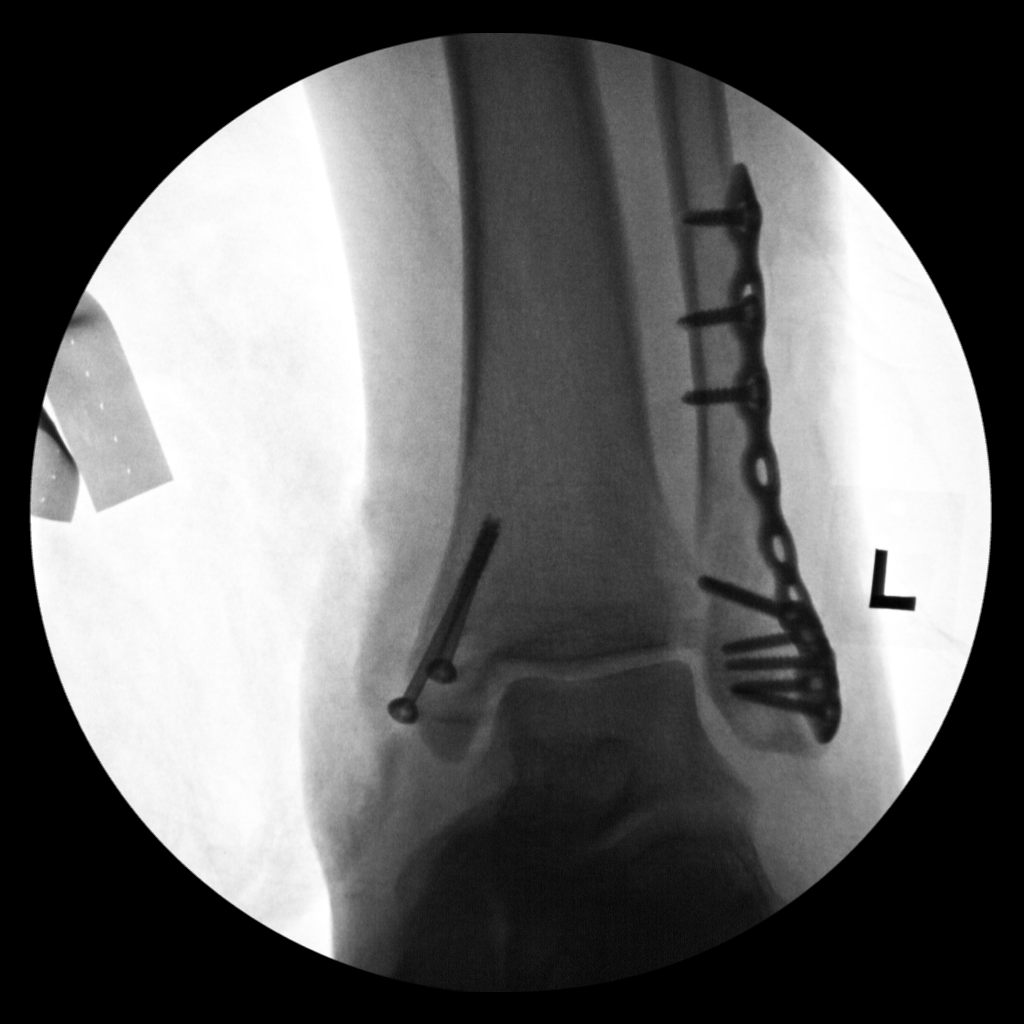
[im 2/4]
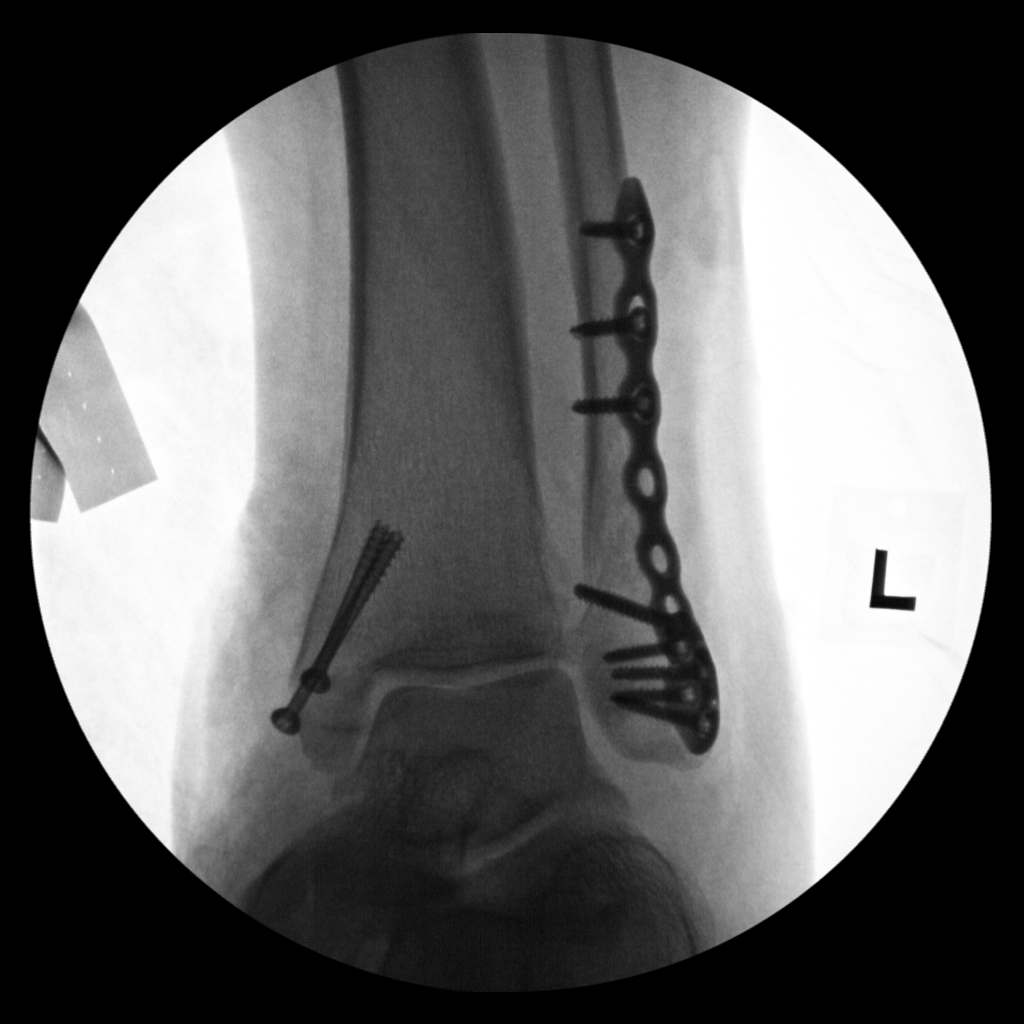
[im 3/4]
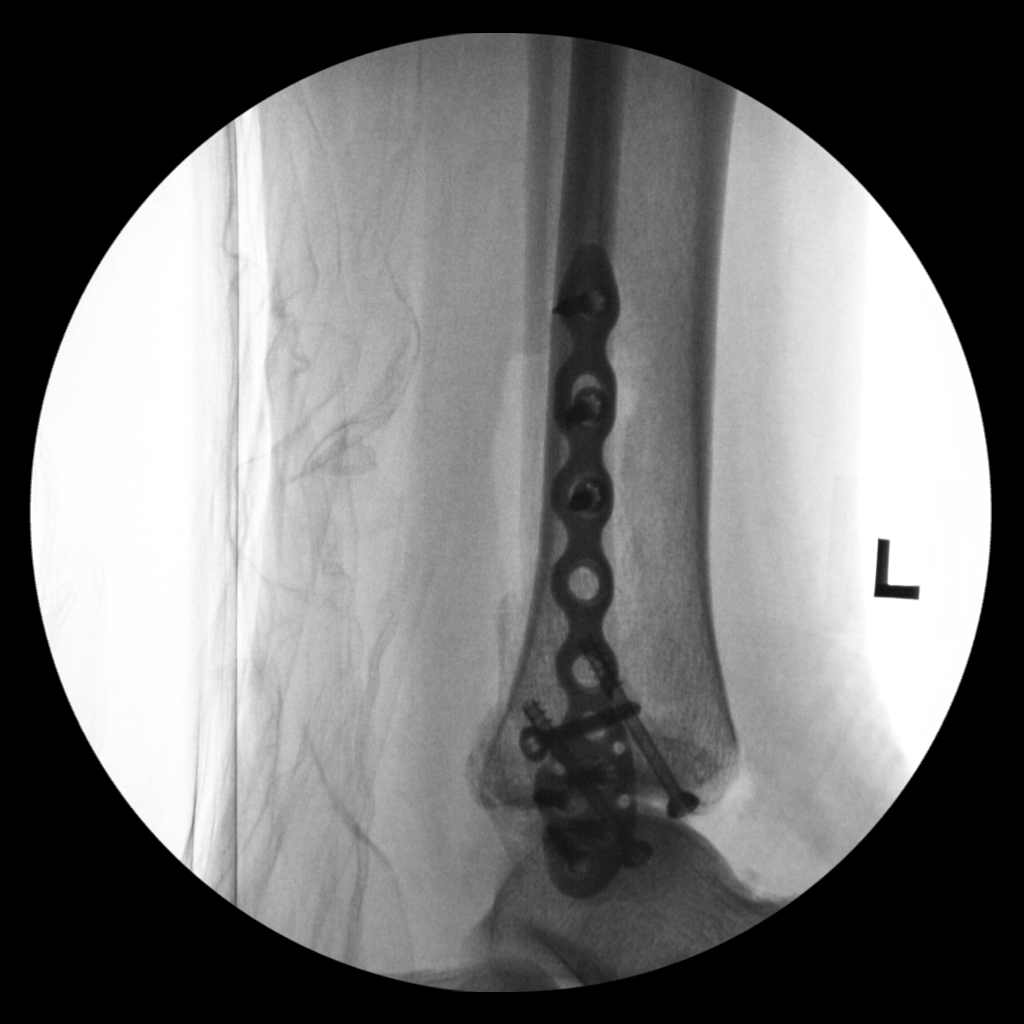
[im 4/4]
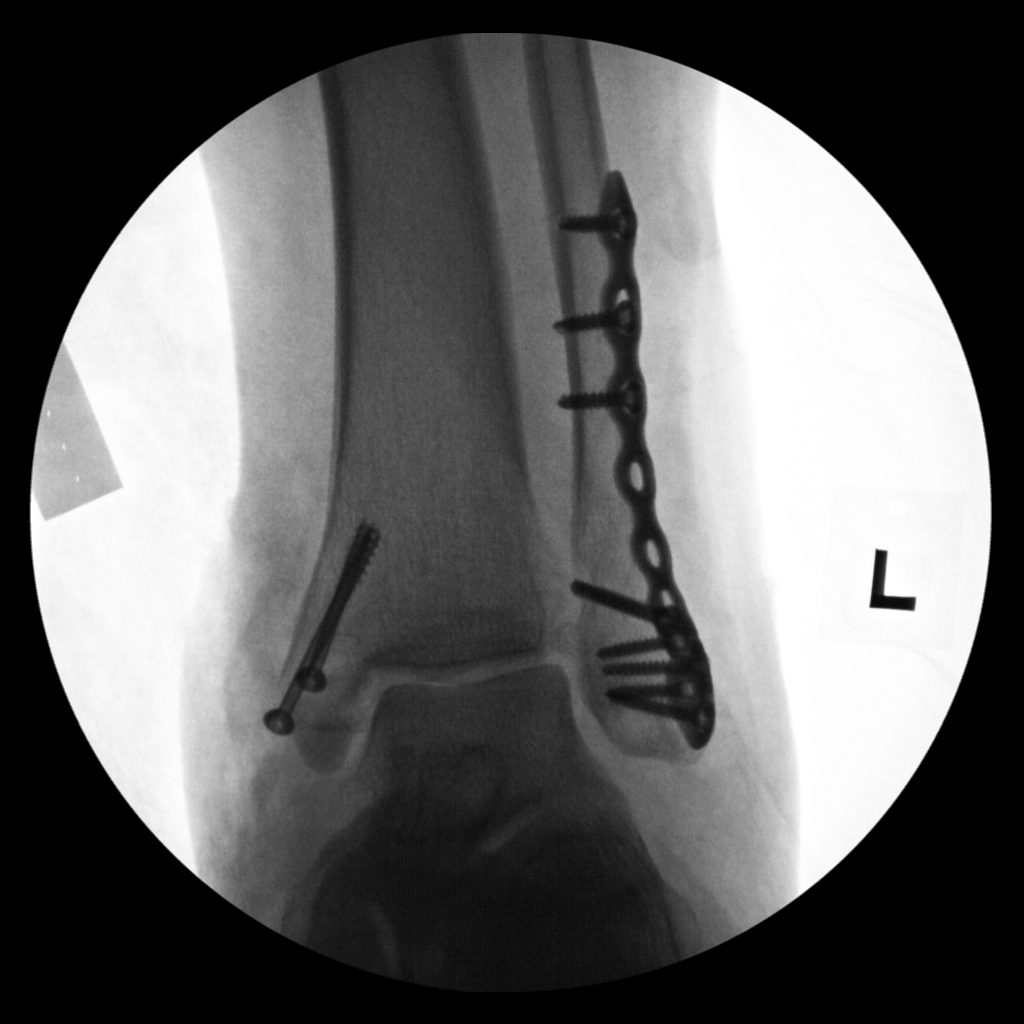

[4 of 4 positions shown; findings below may reference images not displayed]

FINDINGS: Fluoro time: 56 seconds.

Three C-arm fluoroscopic images were obtained intraoperatively and
submitted for post operative interpretation. These images
demonstrate open reduction internal fixation of the previously
described ankle fractures. Please see the performing provider's
procedural report for further detail.
IMPRESSION: Intraoperative fluoroscopic images, as above.

## 2021-09-18 IMAGING — RF DG ANKLE 2V *L*
1 series · 4 of 4 positions shown · non-contrast
Comparison: CT 06/06/2020, radiographs 06/05/2020.

CLINICAL DATA: ORIF

EXAM:
LEFT ANKLE - 2 VIEW; DG C-ARM 1-60 MIN

[Series 1: run · 4 of 4 slices shown]
[im 1/4]
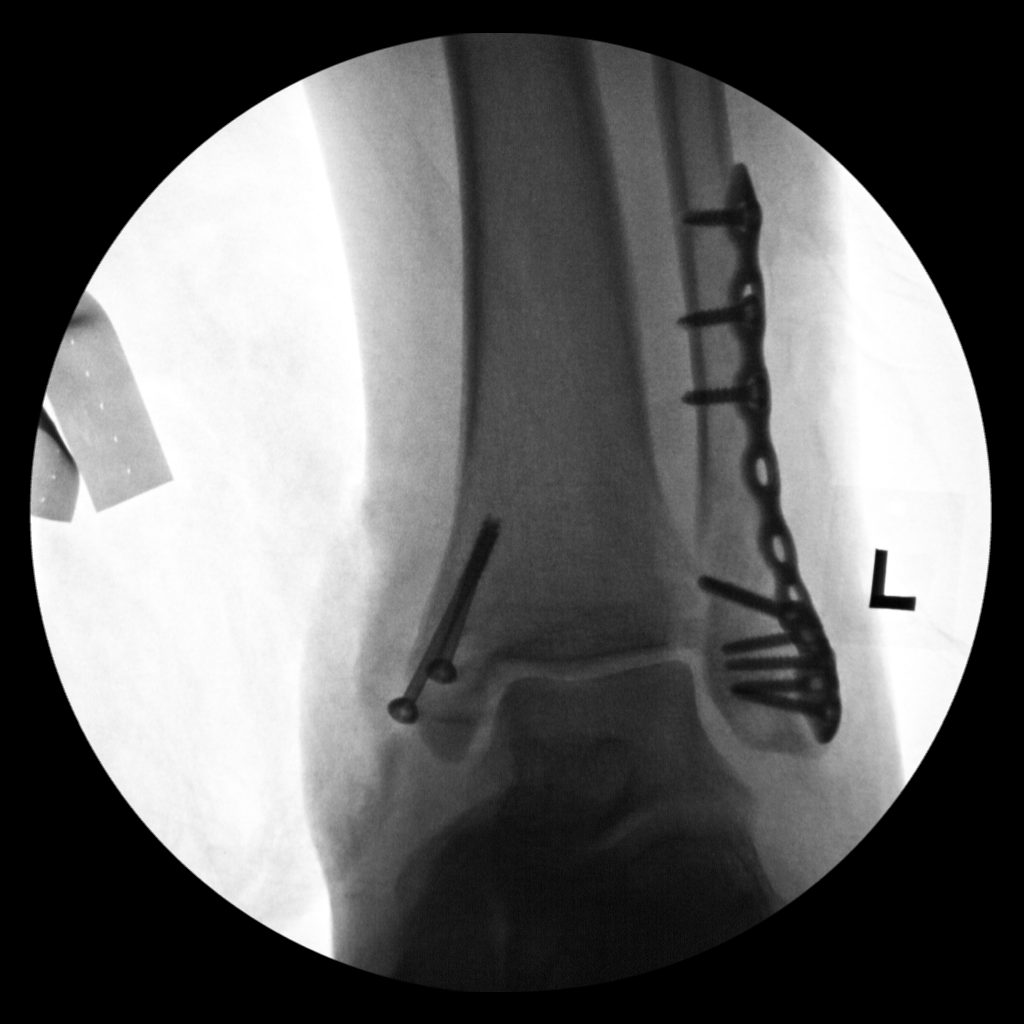
[im 2/4]
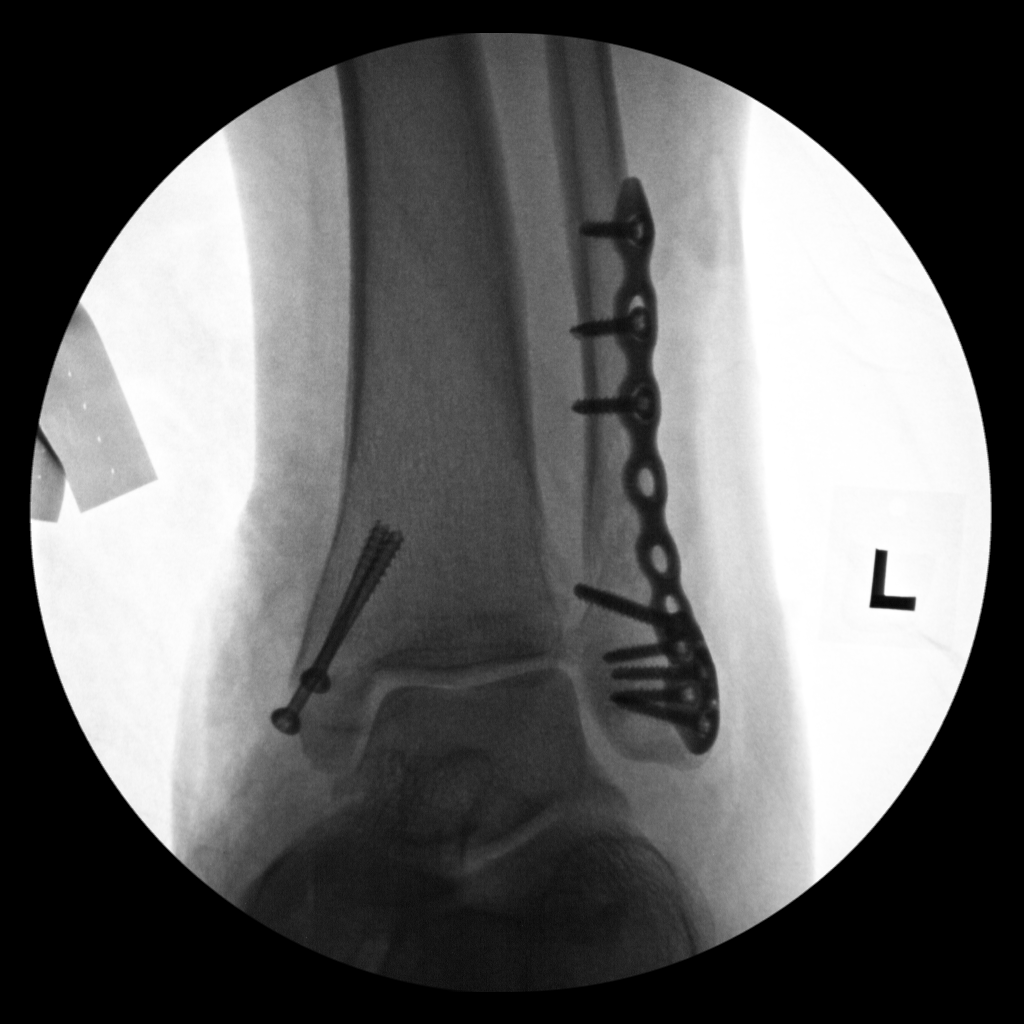
[im 3/4]
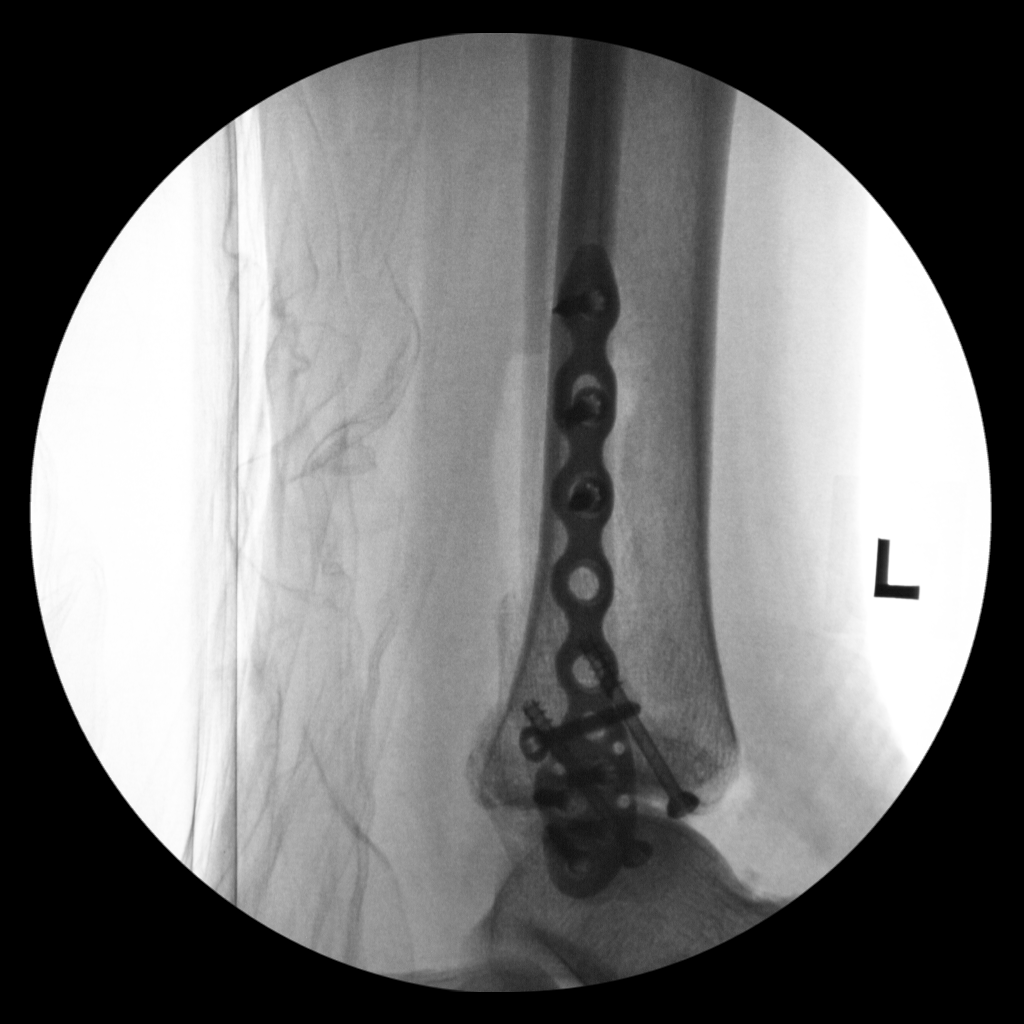
[im 4/4]
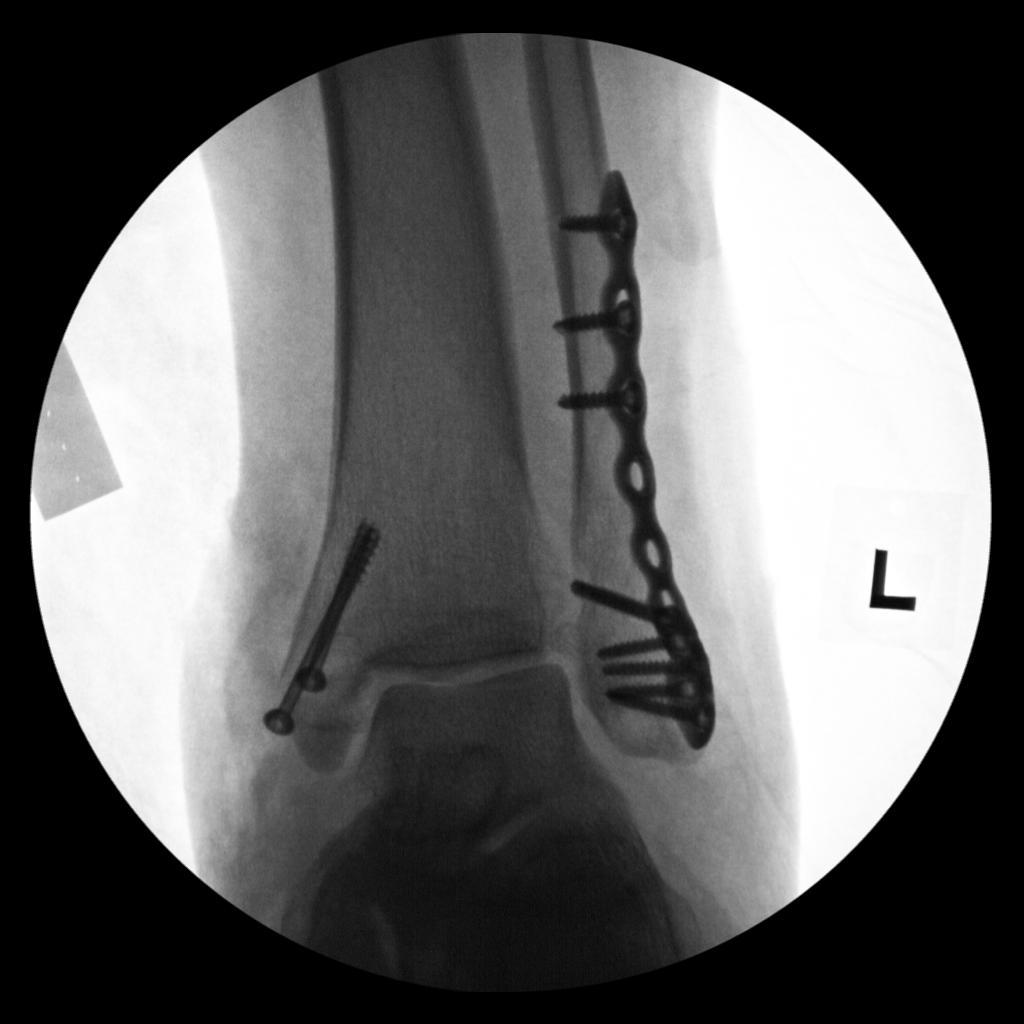

[4 of 4 positions shown; findings below may reference images not displayed]

FINDINGS: Fluoro time: 56 seconds.

Three C-arm fluoroscopic images were obtained intraoperatively and
submitted for post operative interpretation. These images
demonstrate open reduction internal fixation of the previously
described ankle fractures. Please see the performing provider's
procedural report for further detail.
IMPRESSION: Intraoperative fluoroscopic images, as above.
